# Patient Record
Sex: Female | Born: 1995 | Race: White | Hispanic: No | Marital: Single
Health system: Northeastern US, Academic
[De-identification: ages and names within clinical notes are randomized; demographics above are authoritative.]

## PROBLEM LIST (undated history)

## (undated) DIAGNOSIS — S83005A Unspecified dislocation of left patella, initial encounter: Secondary | ICD-10-CM

## (undated) DIAGNOSIS — S8990XA Unspecified injury of unspecified lower leg, initial encounter: Secondary | ICD-10-CM

---
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
## 2016-08-09 ENCOUNTER — Encounter: Payer: Self-pay | Admitting: *Deleted

## 2016-08-09 ENCOUNTER — Emergency Department: Payer: No Typology Code available for payment source

## 2016-08-09 ENCOUNTER — Emergency Department
Admission: EM | Admit: 2016-08-09 | Discharge: 2016-08-09 | Disposition: A | Discharge status: Home / Self Care | Payer: No Typology Code available for payment source | Attending: Emergency Medicine | Admitting: Emergency Medicine

## 2016-08-09 DIAGNOSIS — S83005A Unspecified dislocation of left patella, initial encounter: Secondary | ICD-10-CM

## 2016-08-09 DIAGNOSIS — S8992XA Unspecified injury of left lower leg, initial encounter: Secondary | ICD-10-CM | POA: Diagnosis present

## 2016-08-09 DIAGNOSIS — W51XXXA Accidental striking against or bumped into by another person, initial encounter: Secondary | ICD-10-CM | POA: Diagnosis not present

## 2016-08-09 DIAGNOSIS — Y9389 Activity, other specified: Secondary | ICD-10-CM | POA: Insufficient documentation

## 2016-08-09 DIAGNOSIS — Y999 Unspecified external cause status: Secondary | ICD-10-CM | POA: Insufficient documentation

## 2016-08-09 DIAGNOSIS — Y929 Unspecified place or not applicable: Secondary | ICD-10-CM | POA: Diagnosis not present

## 2016-08-09 HISTORY — DX: Unspecified dislocation of left patella, initial encounter: S83.005A

## 2016-08-09 HISTORY — DX: Unspecified injury of unspecified lower leg, initial encounter: S89.90XA

## 2016-08-09 NOTE — ED Provider Notes (Signed)
[facility] Emergency Department Provider Note  ____________________________________________   I have reviewed the triage vital signs and the nursing notes.   HISTORY  Chief Complaint Knee Injury    HPI Mary Dixon is a 21 y.o. 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 She was at a party and someone bumped into her knee, and she felt that her patella dislocated twice. It then went back into place. This time she has normal discomfort. But she wants to make sure everything is okay. She is needed knee immobilizers and crutches afterwards in the past. She has seen physical therapy and orthopedic surgery for this proclivity she has to dislocate her patella. She denies any other injury. She did not fall she did not hit her head. She states that she otherwise feels well. She has no hip pain and ankle pain numbness or weakness and she does not feel that the joint itself is unstable.     No past medical history on file.  There are no active problems to display for this patient.   No past surgical history on file.  Prior to Admission medications   Not on File    Allergies Patient has no allergy information on record.  No family history on file.  Social History Social History  Substance Use Topics  . Smoking status: Not on file  . Smokeless tobacco: Not on file  . Alcohol use Not on file    Review of Systems See history of present illness otherwise negative  ____________________________________________   PHYSICAL EXAM:  VITAL SIGNS: ED Triage Vitals  Enc Vitals Group     BP 08/09/16 0319 128/85     Pulse Rate 08/09/16 0319 100     Resp 08/09/16 0319 18     Temp 08/09/16 0319 98.1 F (36.7 C)     Temp Source 08/09/16 0319 Oral     SpO2 08/09/16 0319 100 %     Weight 08/09/16 0324 114 lb (51.7 kg)     Height 08/09/16 0324 5\' 3"  (1.6 m)     Head Circumference --      Peak Flow --     Pain Score 08/09/16 0320 2     Pain Loc --      Pain Edu? --      Excl. in GC? --     Constitutional: Alert and oriented. Well appearing and in no acute distress. Musculoskeletal: Is minimal tenderness to palpation to the left knee with a possible slight effusion. There is no ligamentous laxity to varus or valgus strain. She has intact straight leg raise, full painless range of motion of the knee itself with no evidence of dislocation. Strong distal pulses, no murmurs are soft, no hip pain or tenderness no ankle pain or tenderness, there is no focal knee tenderness to palpation. Her patella is not dislocated at this moment. You have Lockman's sign no upper extremity tenderness. No other joint effusions, no DVT signs strong distal pulses no edema Neurologic:  Normal speech and language. No gross focal neurologic deficits are appreciated.  Skin:  Skin is warm, dry and intact. No rash noted. Psychiatric: Mood and affect are normal. Speech and behavior are normal.  ____________________________________________   LABS (all labs ordered are listed, but only abnormal results are displayed)  Labs Reviewed - No data to display ____________________________________________  EKG  I personally interpreted any EKGs ordered by me or triage  ____________________________________________  RADIOLOGY  I  reviewed any imaging ordered by me or triage that were performed during my shift and, if possible, patient and/or family made aware of any abnormal findings. ____________________________________________   PROCEDURES  Procedure(s) performed: None  Procedures  Critical Care performed: None  ____________________________________________   INITIAL IMPRESSION / ASSESSMENT AND PLAN / ED COURSE  Pertinent labs & imaging results that were available during my care of the patient were reviewed by me and considered in my medical decision making (see chart for details).  Patient likely dislocated her  patella as she has done this multiple times in the past. However, at this time there is no evidence of disruption of the patellar tendon, she has intact straight leg raise there is no evidence of significant intra-joint pathology such as anterior cruciate ligament tear etc. however, sometimes is difficult to know in the acute phase so we will place her in a knee immobilizer and crutches pending outpatient follow-up with orthopedic. I will obtain an x-ray as a precaution although very low suspicion for fracture, nor is there any evidence at this time of vascular injury from this event. Clearly patella as the patient does not usually require vascular evaluation and there is no evidence to she had a dislocated knee. The patient is strong distal pulses Refill is normal. There is no evidence of other injury. On her pain is under control at this time.    ____________________________________________   FINAL CLINICAL IMPRESSION(S) / ED DIAGNOSES  Final diagnoses:  None      This chart was dictated using voice recognition software.  Despite best efforts to proofread,  errors can occur which can change meaning.      Jeanmarie Plant, MD 08/09/16 0330

## 2016-08-09 NOTE — ED Triage Notes (Signed)
Pt has past hx of knee injuries, at least once has dislocated L kneecap. Pt presents after someone fell on her L leg, Pt had another person fall into her L leg and states that it "dslocated twice". Pt denies altered sensation distal to injury to L knee.

## 2016-08-09 NOTE — ED Notes (Signed)
Pt presents to ED 04 c/o knee dislocation; pt states history of same; this occurrence as a result of an injury incurred due to someone falling on her knee around 0200; pt states since she's had her knee dislocated before, she knew what it felt like; pt rates pain at 3/10; pt states taking one ibuprofen prior to coming to ED; distal pulses palpable at dorsal pedis (+2) and cap refill at <3 sec. Pt is awake, alert and oriented x4; able to speak in complete sentences; eyes equal round and reactive.

## 2017-01-16 LAB — BMP (EXT)
Anion Gap (EXT): 12 meq/L (ref 8–16)
BUN (EXT): 11 mg/dL (ref 7–23)
CO2 (EXT): 28 meq/L (ref 22–31)
CalciumCalcium (EXT): 9.7 mg/dL (ref 8.5–10.4)
Chloride (EXT): 104 meq/L (ref 97–110)
Creatinine (EXT): 0.6 mg/dL (ref 0.6–1.0)
Glucose (EXT): 89 mg/dL (ref 70–100)
Potassium (EXT): 4.3 meq/L (ref 3.3–5.0)
Sodium (EXT): 144 meq/L (ref 135–145)
eGFR - Creat MDRD (EXT): >60

## 2017-01-16 LAB — CHLAMYDIA (EXT): Chlamydia Trachomatis, Urine (EXT): NEGATIVE

## 2018-04-29 IMAGING — CR DG KNEE COMPLETE 4+V*L*
4 series · 4 of 4 positions shown · non-contrast
Comparison: 04/21/2014

CLINICAL DATA: History of patellar dislocation

EXAM:
LEFT KNEE - COMPLETE 4+ VIEW

[knee ap]
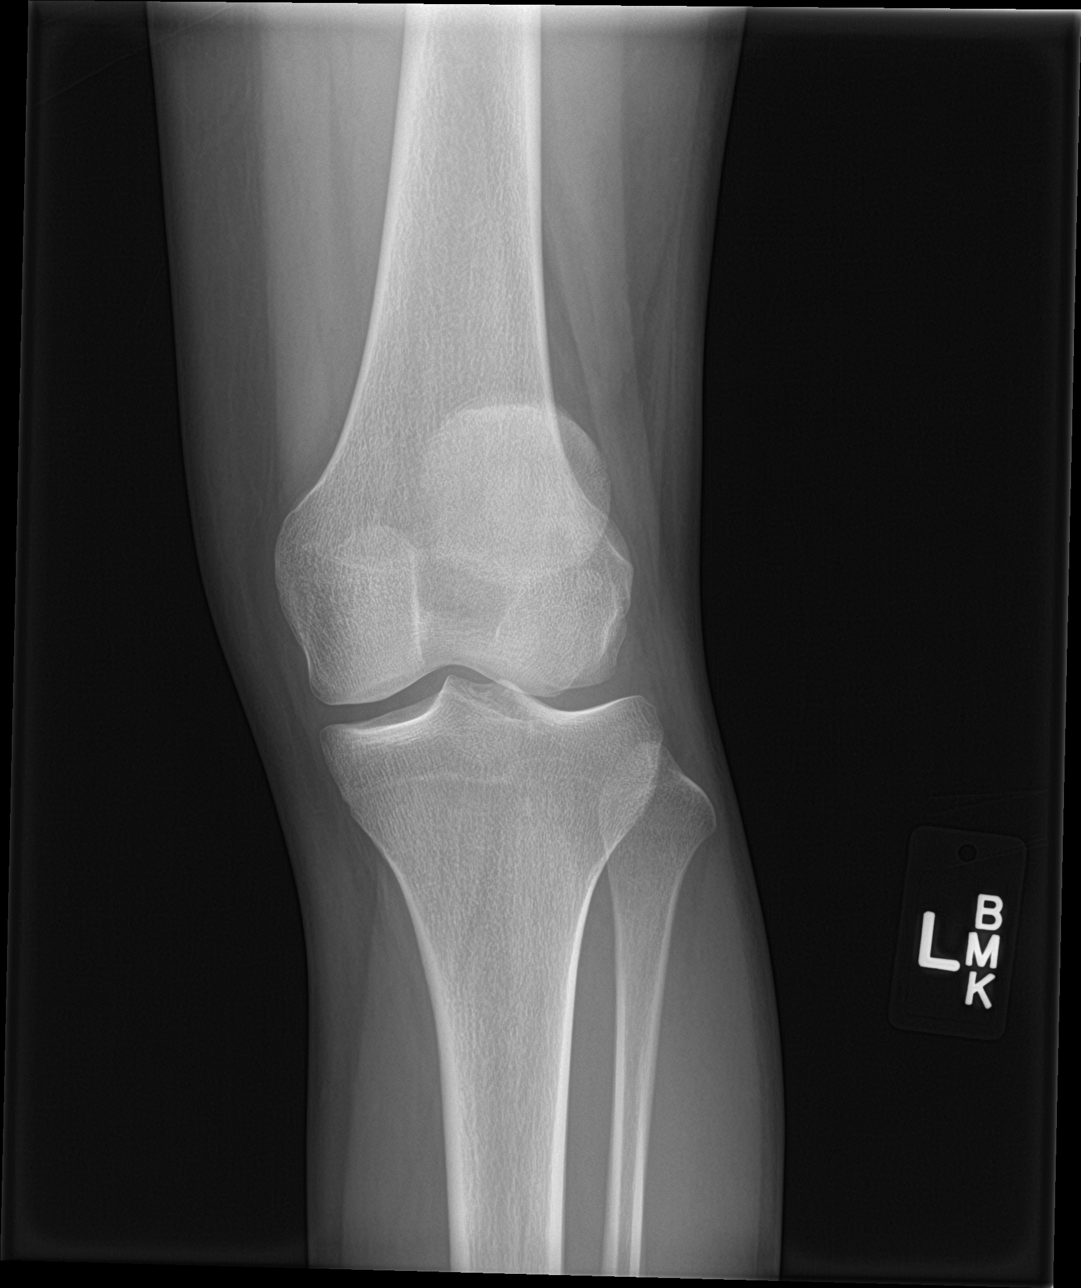

[knee obl (1 of 2)]
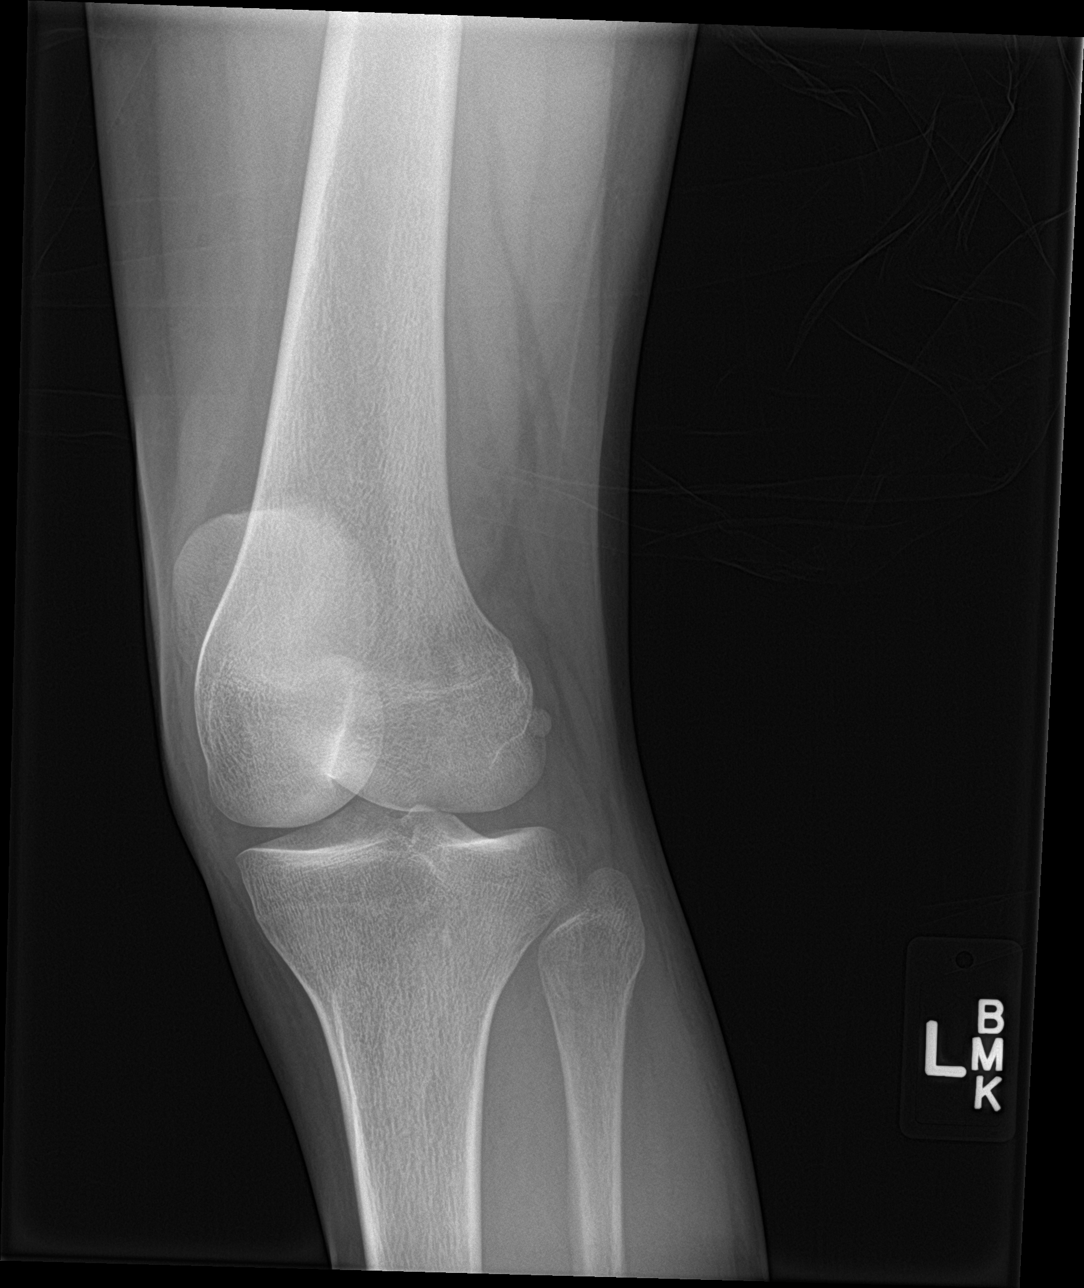

[knee obl (2 of 2)]
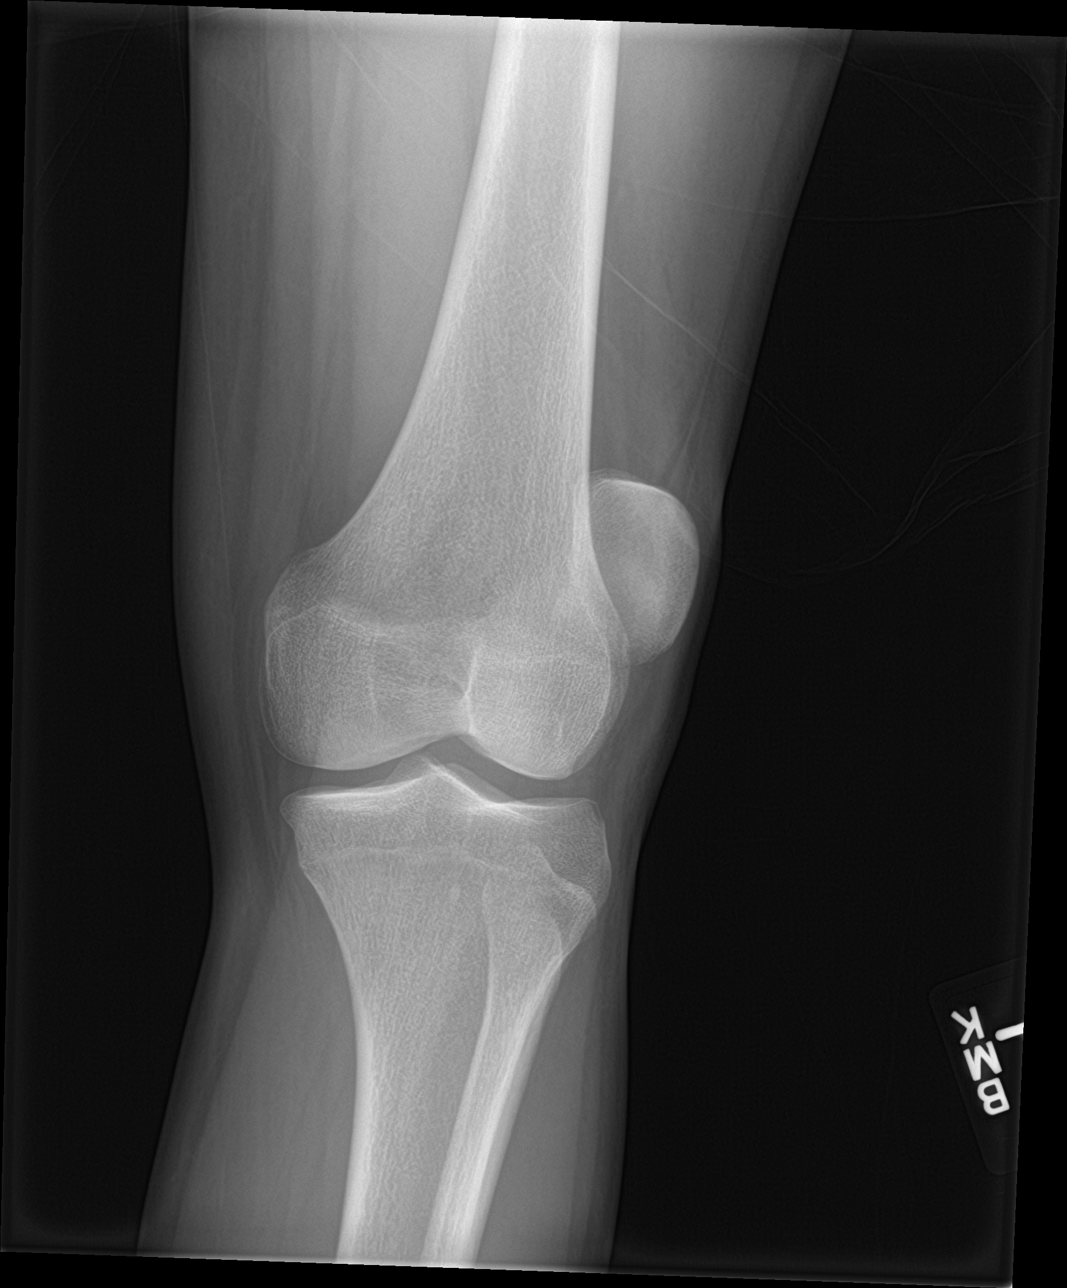

[knee lat]
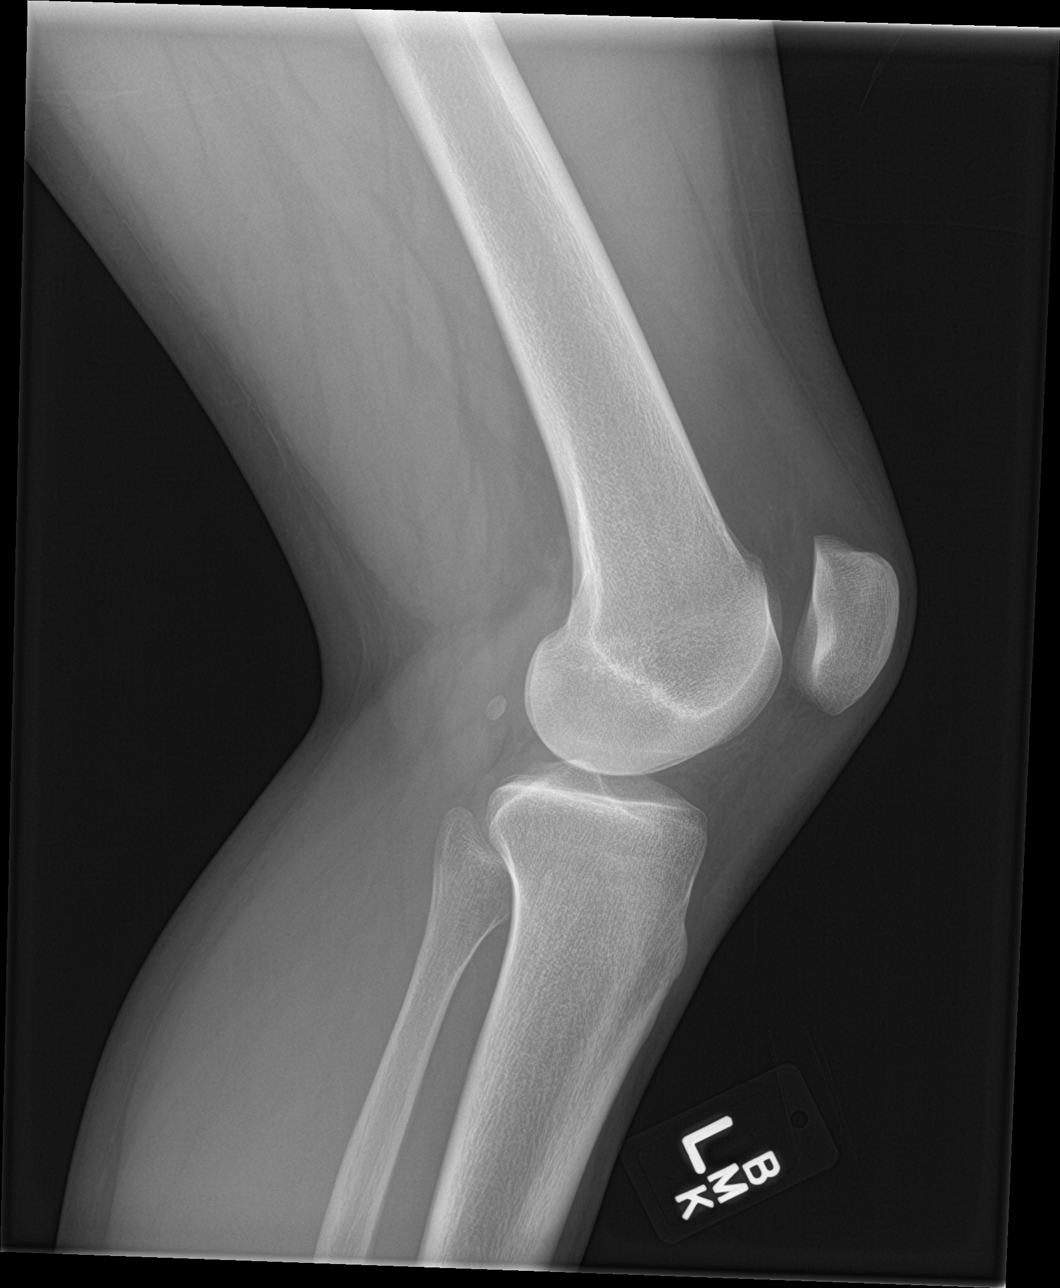

[4 of 4 positions shown; findings below may reference images not displayed]

FINDINGS: No fracture or malalignment. Soft tissues are unremarkable. Joint
spaces are relatively preserved.
IMPRESSION: Negative.

## 2019-04-15 LAB — UNMAPPED LAB RESULTS: Chlamydia trachomatis (EXT): NEGATIVE

## 2021-12-05 NOTE — Telephone Encounter (Signed)
Noted  

## 2021-12-05 NOTE — Telephone Encounter (Signed)
Patient called to be set up as a new patient of:  Dr.Pimrale    New Patient appointment scheduled with: Dr.Pimrale    Date and time of appointment: 12/12/2021  New patient appointment sub-type (physical, sick visit (provide symptoms), consult, etc.): new patient     Patient's last PCP (name): Dr.pinto   Location of last PCP (phone number & address): [place]   Date of Last Annual Physical: year ago   Date of Last Medicare Wellness (if age 26+): n/a  Is there anyone you would authorize to verbally speak for you (make appts, get lab results, etc)? NO  (If yes, list person(s) & notify patient signed release needed)   Pharmacy name & city (also add to Medication Management section):  [facility] [phone]    Instructed Patient: Office no longer accepts Cash as a form of payment. Forms of Payments accepted are Check, Debit or Credit.  Please bring a Photo ID, Insurance Card, and Current medication bottles.   Please arrive 15-20 minutes prior to appointment due to paperwork needing to be filled out.  Mask is required in the office at all times.    For Office: Runner, broadcasting/film/videoend retrieval authorization (does not need signature from patient) to the above former PCP.    [place].    Call back number: [phone]

## 2021-12-12 ENCOUNTER — Ambulatory Visit
Admit: 2021-12-12 | Discharge: 2021-12-12 | Payer: PRIVATE HEALTH INSURANCE | Attending: Internal Medicine | Primary: Internal Medicine

## 2021-12-12 DIAGNOSIS — Z0000 Encounter for general adult medical examination without abnormal findings: Secondary | ICD-10-CM

## 2021-12-12 DIAGNOSIS — L723 Sebaceous cyst: Secondary | ICD-10-CM

## 2021-12-12 NOTE — Patient Instructions (Signed)
Pl come back for fasting labs.  Sch physical next year.  Ref to surgery.

## 2021-12-12 NOTE — Progress Notes (Signed)
Sydney Edwards is a 26 y.o.yrs. old female here for an annual physical exam.  She is a new patient to our practice and relatively new in [place].  She works as a Adult nurse at [employer].  She does not have any significant past medical history except for sebaceous cyst on the right side of her neck.  She denies any flulike illness, chest pains, trouble breathing, abdominal or urinary symptoms.  Patient is concerned about the cyst.  She says that it was checked out by her dermatologist in [place] but in the past couple weeks it has grown a little bit in size.  It was a little painful in the beginning but now the pain is better.  She denies any fever or chills.    Allergies medications past medical history social history family history review systems were reviewed in Epic.    PROBLEM LIST:  There is no problem list on file for this patient.      MEDICAL HISTORY:  No past medical history on file.    SURGICAL HISTORY:  No past surgical history on file.     CURRENT MEDICATIONS:    Current Outpatient Medications   Medication Instructions   . levonorgestrel (Mirena) 21 mcg/24 hours (8 yrs) 52 mg IUD 1 Device, intrauterine        ALLERGIES:  No Known Allergies    SOCIAL HISTORY:    Social History     Tobacco Use   . Smoking status: Never   . Smokeless tobacco: Never   Substance Use Topics   . Alcohol use: Yes   . Drug use: Never           DIET: Eats healthy       EXERCISE: Most of the days of the week.     FAMILY HISTORY:    Family History   Problem Relation Name Age of Onset   . Hyperlipidemia Mother     . Gout Father          IMMUNIZATIONS  Immunization History   Administered Date(s) Administered   . Covid-19 Moderna vaccine monovalent (12y+) 06/24/2019, 07/22/2019, 04/21/2020   . Hep B, adult 07/15/1999, 08/14/2020   . Influenza, Unspecified 02/21/2020   . Influenza, injectable, quadrivalent, preservative free 03/05/2018, 02/27/2019, 04/10/2021   . Influenza, seasonal, injectable  06/29/2015   . MMR 11/07/1997   . PPD Test 03/22/2020   . Tdap 02/03/2017   . Varicella 12/22/2006         REVIEW OF SYSTEMS:    HEAD -  Frequent Headaches? No  Headaches severe enough to miss work/school? No  Migraine headaches? No    EYES -   Recent change in vision? No  Sudden visual loss or decrease in vision? No  Double vision? No  Do you wear glasses or contacts? No  When was your last eye exam? -   Any history of glaucoma? No    EARS -  Frequent ear infections? No  Ear pain? No    NOSE -  Hay fever? No  If so, what season? -   Frequent nasal congestion? No    MOUTH -  Frequent sore throat or strep throat? No  Difficulty swallowing? No  Last Dental visit? -     RESPIRATORY -  Shortness of breath? No  Consistent cough? No  TB in family or close contacts? No  Do you have asthma? No    CARDIOVASCULAR -  Ever had high blood pressure? No  Ever had  a heart attack/angina? No  Chest pain (pressure or tightness)? No  Palpitations or rapid pulse? No    GASTROINTESTINAL -  Abdominal pain? No  Frequent abdominal bloating/gassiness? No  Frequent nausea or vomiting? No  Regurgitation of acid or food (heartburn)? No  Recent change in bowel movements? No  Significant hemorrhoids or rectal pain? No  Blood in bowel movements? No  Black bowel movements (like tar)? No  Family history of bowel cancer? No    UROLOGICAL -  Frequent bladder infections? No  Burning or pain when urinating? No  Normal stream of urine? No  Do you get up at night to urinate? No  If yes, how many times? -  Lose urine when coughing or straining? No  Any bloody, brown or pink urine? No    EXTREMITIES/JOINTS -  Back or neck pain? No  Any unusual or persistent muscle pain? No  Limitation of any joints? No  Leg or ankle swelling? No    NERVOUS SYSTEM -  Any numbness or tingling? No  Weakness of any extremities (arms, legs, hands, feet)? No  Any vertigo or severe dizziness? No  Fainting or blackouts? No    SKIN -  Any bumps/sores that have changed or need  checking? yes  Any freckles that are big or changing? No  Eczema, psoriasis or other skin problem? No    BLOOD -  Do you have anemia? No  Do you bruise easily? No    FEMALES -  Menopause? No  Menstrual periods regular? Yes  How far apart (start to start)? -   How many flow days? -   Date of last menstrual period? -  Painful menstrual periods? No  Bleeding between menstrual periods? No  Birth control type? -   Vaginal discharge? No  Ever had a sexually transmitted disease? No  Last pap smear? -  Any abnormal pap smears? No  Do you do monthly self-breast exams? Yes  Date of last mammogram? -   Family history of breast cancer? No  Sexual problem? No    PSYCH -  Depression or anxiety? No  Any mood changes? No  Do you have trouble sleeping? No    WEIGHT -  Any recent weight gain or loss? No  If so, how much? -  Have you ever had an eating disorder? No    ALCOHOL INTAKE -  Do you drink alcohol? No  If so, what type? -  How much? -  How often do you drink? -  Ever had problems connected to drinking? No    TOBACCO -  Do you smoke? No  If so, how much? -  Have you ever smoked? No  If so, when did you quit? -  Do you smoke cigars or chew tobacco? No    OTHER -  Do you wear a seat belt when driving? Yes  Do you use Marijuana, Cocaine, or other? No  How often? -  Other? -    HAVE A HEALTH CARE PROXY? No    Physical Exam  Visit Vitals  BP 120/86 (BP Location: Left arm, Patient Position: Sitting, BP Cuff Size: Adult)   Pulse 73   Temp 37.1 C (98.8 F)   Ht 1.613 m   Wt 51.7 kg   SpO2 99%   BMI 19.88 kg/m   BSA 1.52 m      Alert oriented x 3 in no acute distress.  HEENT: Head atraumatic, normocephalic.  Pupils equal round and reactive, extra  occular muscles intact.no pallor, no icterus.   Oropharynx clear, no exudate, no erythema. Tonsils nontender on palpation.  Ears- tympanic membranes nl bilateral.  Neck: supple, No lymphadenopathy, no bruits, no thyromegaly.  On the right side of the neck on the posterior aspect there is a  cyst about roughly 2 cm, round, nontender, freely mobile.  Lungs: CTA bilateral, no added sounds.  Heart:s1 and s2 Regular, No murmurs, rubs, gallops.  Breasts: b/l exam- non tender to palpate, no abnormal masses felt, no abn skin changes,no rashes. No nipple discharge. No axillary or cervical lymphadenopathy.  Abd: soft, non tender, bowel sounds heard, no palpable organomegaly, no cva tenderness.  Skin: no atypical nevi or suspicious lesions seen.  Gyn: Deferred for OB/GYN.    Ext: no edema, pulses intact, no clubbing, no cyanosis.  Neuro: CN- 2-12 grossly intact.  Reflexes intact, sensations grossly intact.strength 5/5 throughout.  gait normal,   Cerebellar signs - negative.    Assessment and plan-  26 year old patient comes in for physical examination.  Lump on the neck-?  Sebaceous cyst.  Slightly bigger compared to before per patient.  Referral to surgeon for further evaluation.  Health maintenance-stressed the importance of taking calcium vitamin D, seatbelt use, sunscreen use, seeing a dentist dermatologist and an eye doctor.  She follows with her gynecologist for routine GYN care/IUD checkup  Fasting labs ordered.  Follow-up as needed or schedule a physical for next year.      Electronically signed by;   Raliegh Scarlet, MD    This documentation is transcribed using voice recognition software. Incorrect words or phrases may be inserted or deleted by the software which may alter the intended accuracy of the final document.     Note to patient: The 21st Century cures act makes medical notes available to patients in the interest of transparency. Be advised this is a medical document. It is intended primarily as a Corporate investment banker. It is written in medical language and may contain abbreviations or verbiage that are unfamiliar. It may appear blunt or direct. This is because medical documents are intended to carry relevant information, facts as evident, and the clinical opinion of the physician only as  formulated at the time of writing.

## 2021-12-26 ENCOUNTER — Other Ambulatory Visit: Admit: 2021-12-26 | Payer: PRIVATE HEALTH INSURANCE | Primary: Internal Medicine

## 2021-12-26 DIAGNOSIS — Z0000 Encounter for general adult medical examination without abnormal findings: Secondary | ICD-10-CM

## 2021-12-26 LAB — CBC
Hematocrit: 43.3 % (ref 32.0–47.0)
Hemoglobin: 14 g/dL (ref 11.0–16.0)
MCH: 29.8 pg (ref 26.0–34.0)
MCHC: 32.3 g/dL (ref 31.0–37.0)
MCV: 92.1 fL (ref 80.0–100.0)
MPV: 10.4 fL (ref 9.1–12.4)
NRBC %: 0 % (ref 0.0–0.0)
NRBC Absolute: 0 10*3/uL (ref 0.00–2.00)
Platelets: 275 10*3/uL (ref 150–400)
RBC: 4.7 M/uL (ref 3.70–5.20)
RDW-CV: 12.6 % (ref 11.5–14.5)
RDW-SD: 42.7 fL (ref 35.0–51.0)
WBC: 5.2 10*3/uL (ref 4.0–11.0)

## 2021-12-26 LAB — LIPID PANEL
Cholesterol: 241 mg/dL — ABNORMAL HIGH (ref <=200)
HDL cholesterol: 111 mg/dL (ref >=40)
LDL cholesterol, calculated: 117 mg/dL (ref 0–130)
Triglycerides: 64 mg/dL (ref <=150)

## 2021-12-26 LAB — TSH WITH REFLEX: TSH: 0.639 u[IU]/mL (ref 0.358–3.740)

## 2021-12-26 LAB — BASIC METABOLIC PANEL
Anion Gap: 6 mmol/L (ref 3–14)
BUN: 7 mg/dL (ref 6–24)
CO2 (Bicarbonate): 28 mmol/L (ref 20–32)
Calcium: 9.3 mg/dL (ref 8.5–10.5)
Chloride: 105 mmol/L (ref 98–110)
Creatinine: 0.83 mg/dL (ref 0.55–1.30)
Glucose: 86 mg/dL (ref 70–99)
Potassium: 4.4 mmol/L (ref 3.6–5.2)
Sodium: 139 mmol/L (ref 135–146)
eGFRcr: 100 mL/min/{1.73_m2} (ref >=60)

## 2021-12-26 LAB — HEPATIC FUNCTION PANEL
ALT: 21 U/L (ref 0–55)
AST: 22 U/L (ref 6–42)
Albumin: 3.8 g/dL (ref 3.2–5.0)
Alkaline phosphatase: 33 U/L (ref 30–130)
Bilirubin, direct: 0.1 mg/dL (ref 0.0–0.5)
Bilirubin, total: 0.7 mg/dL (ref 0.2–1.2)
Protein, total: 7 g/dL (ref 6.0–8.4)
